# Patient Record
Sex: Male | Born: 1940 | Race: White | Hispanic: No | Marital: Married | Smoking: Never smoker
Health system: Southern US, Community
[De-identification: ages and names within clinical notes are randomized; demographics above are authoritative.]

## PROBLEM LIST (undated history)

## (undated) DIAGNOSIS — I2510 Atherosclerotic heart disease of native coronary artery without angina pectoris: Secondary | ICD-10-CM

## (undated) DIAGNOSIS — G20A1 Parkinson's disease without dyskinesia, without mention of fluctuations: Secondary | ICD-10-CM

## (undated) DIAGNOSIS — I219 Acute myocardial infarction, unspecified: Secondary | ICD-10-CM

## (undated) DIAGNOSIS — G20 Parkinson's disease: Secondary | ICD-10-CM

## (undated) DIAGNOSIS — I509 Heart failure, unspecified: Secondary | ICD-10-CM

## (undated) DIAGNOSIS — I10 Essential (primary) hypertension: Secondary | ICD-10-CM

## (undated) HISTORY — PX: PACEMAKER INSERTION: SHX728

---

## 2018-10-07 ENCOUNTER — Other Ambulatory Visit: Payer: Self-pay

## 2018-10-07 ENCOUNTER — Emergency Department (HOSPITAL_BASED_OUTPATIENT_CLINIC_OR_DEPARTMENT_OTHER)
Admission: EM | Admit: 2018-10-07 | Discharge: 2018-10-07 | Disposition: A | Discharge status: Other Acute Inpatient Hospital | Payer: Medicare HMO | Attending: Emergency Medicine | Admitting: Emergency Medicine

## 2018-10-07 ENCOUNTER — Encounter (HOSPITAL_BASED_OUTPATIENT_CLINIC_OR_DEPARTMENT_OTHER): Payer: Self-pay | Admitting: Emergency Medicine

## 2018-10-07 ENCOUNTER — Emergency Department (HOSPITAL_BASED_OUTPATIENT_CLINIC_OR_DEPARTMENT_OTHER): Payer: Medicare HMO

## 2018-10-07 DIAGNOSIS — I110 Hypertensive heart disease with heart failure: Secondary | ICD-10-CM | POA: Insufficient documentation

## 2018-10-07 DIAGNOSIS — I252 Old myocardial infarction: Secondary | ICD-10-CM | POA: Diagnosis not present

## 2018-10-07 DIAGNOSIS — G20 Parkinson's disease: Secondary | ICD-10-CM | POA: Diagnosis not present

## 2018-10-07 DIAGNOSIS — I509 Heart failure, unspecified: Secondary | ICD-10-CM | POA: Diagnosis not present

## 2018-10-07 DIAGNOSIS — Z79899 Other long term (current) drug therapy: Secondary | ICD-10-CM | POA: Insufficient documentation

## 2018-10-07 DIAGNOSIS — R079 Chest pain, unspecified: Secondary | ICD-10-CM | POA: Diagnosis present

## 2018-10-07 HISTORY — DX: Acute myocardial infarction, unspecified: I21.9

## 2018-10-07 HISTORY — DX: Atherosclerotic heart disease of native coronary artery without angina pectoris: I25.10

## 2018-10-07 HISTORY — DX: Essential (primary) hypertension: I10

## 2018-10-07 HISTORY — DX: Parkinson's disease: G20

## 2018-10-07 HISTORY — DX: Parkinson's disease without dyskinesia, without mention of fluctuations: G20.A1

## 2018-10-07 HISTORY — DX: Heart failure, unspecified: I50.9

## 2018-10-07 LAB — BRAIN NATRIURETIC PEPTIDE: B Natriuretic Peptide: 391.7 pg/mL — ABNORMAL HIGH (ref 0.0–100.0)

## 2018-10-07 LAB — BASIC METABOLIC PANEL
Anion gap: 7 (ref 5–15)
BUN: 15 mg/dL (ref 8–23)
CO2: 25 mmol/L (ref 22–32)
CREATININE: 1.27 mg/dL — AB (ref 0.61–1.24)
Calcium: 8.3 mg/dL — ABNORMAL LOW (ref 8.9–10.3)
Chloride: 104 mmol/L (ref 98–111)
GFR calc Af Amer: >60 mL/min (ref >60)
GFR calc non Af Amer: 53 mL/min — ABNORMAL LOW (ref >60)
GLUCOSE: 122 mg/dL — AB (ref 70–99)
Potassium: 3.3 mmol/L — ABNORMAL LOW (ref 3.5–5.1)
Sodium: 136 mmol/L (ref 135–145)

## 2018-10-07 LAB — CBC
HCT: 47 % (ref 39.0–52.0)
Hemoglobin: 15 g/dL (ref 13.0–17.0)
MCH: 32.8 pg (ref 26.0–34.0)
MCHC: 31.9 g/dL (ref 30.0–36.0)
MCV: 102.6 fL — ABNORMAL HIGH (ref 80.0–100.0)
Platelets: 225 10*3/uL (ref 150–400)
RBC: 4.58 MIL/uL (ref 4.22–5.81)
RDW: 11.9 % (ref 11.5–15.5)
WBC: 7.1 10*3/uL (ref 4.0–10.5)
nRBC: 0 % (ref 0.0–0.2)

## 2018-10-07 LAB — TROPONIN I: Troponin I: 0.07 ng/mL (ref <0.03)

## 2018-10-07 MED ORDER — NITROGLYCERIN 0.4 MG SL SUBL
0.4000 mg | SUBLINGUAL_TABLET | Freq: Once | SUBLINGUAL | Status: AC
  Administered 2018-10-07: 0.4 mg via SUBLINGUAL
  Filled 2018-10-07: qty 1

## 2018-10-07 MED ORDER — SODIUM CHLORIDE 0.9% FLUSH
3.0000 mL | Freq: Once | INTRAVENOUS | Status: DC
  Filled 2018-10-07: qty 3

## 2018-10-07 NOTE — ED Notes (Signed)
Patient transported to X-ray 

## 2018-10-07 NOTE — ED Notes (Addendum)
MD Pickering informed of Troponin 0.07  results

## 2018-10-07 NOTE — ED Provider Notes (Addendum)
[facility] EMERGENCY DEPARTMENT Provider Note   CSN: [id number] Arrival date & time: 10/07/18  1228    History   Chief Complaint Chief Complaint  Patient presents with  . Chest Pain    HPI Victor Miller is a 78 y.o. male.     HPI Patient presents with chest pain.  Described as more of a tightness.  Has been going since yesterday.  History of coronary artery disease shortness of breath CHF.  Recent admission to the hospital for pneumonia.  Was admitted to [facility].  Has a history of asbestosis also.  Also COPD.  On chronic oxygen.  States he feels as if he is carrying extra fluid.  Pain is dull.  Has had an occasional cough.  No sputum production.  Has a pacemaker. Past Medical History:  Diagnosis Date  . CHF (congestive heart failure) (HCC)   . Coronary artery disease   . Hypertension   . MI (myocardial infarction) (HCC)   . Parkinson's disease (HCC)     There are no active problems to display for this patient.   Past Surgical History:  Procedure Laterality Date  . PACEMAKER INSERTION          Home Medications    Prior to Admission medications   Medication Sig Start Date End Date Taking? Authorizing Provider  amantadine (SYMMETREL) 100 MG capsule Take by mouth. 04/23/18  Yes [provider]  carbidopa-levodopa (SINEMET IR) 25-100 MG tablet Take 2 tablets at 8am, 11am, 2pm, 5pm and 8pm 07/17/18  Yes [provider]  carvedilol (COREG) 6.25 MG tablet Take by mouth. 10/02/18  Yes [provider]  escitalopram (LEXAPRO) 20 MG tablet Take by mouth. 07/19/18  Yes [provider]  furosemide (LASIX) 40 MG tablet Take by mouth. 10/02/18  Yes [provider]  ipratropium-albuterol (DUONEB) 0.5-2.5 (3) MG/3ML SOLN Use one vial via nebulizer and inhale four times a day. 10/01/18  Yes [provider]  isosorbide mononitrate (IMDUR) 30 MG 24 hr tablet Take by mouth. 10/02/18  Yes [provider]  lisinopril  (PRINIVIL,ZESTRIL) 20 MG tablet Take by mouth. 09/17/18  Yes [provider]  omeprazole (PRILOSEC) 20 MG capsule Take by mouth. 09/10/18  Yes [provider]  QUEtiapine (SEROQUEL) 25 MG tablet Take by mouth. 04/23/18  Yes [provider]  traMADol Janean Sark) 50 MG tablet Take by mouth. 09/10/18 12/09/18 Yes [provider]  traZODone (DESYREL) 50 MG tablet Take by mouth. 04/23/18  Yes [provider]  aspirin 81 MG tablet Take by mouth.    [provider]  atorvastatin (LIPITOR) 20 MG tablet Take by mouth.    [provider]  nitroGLYCERIN (NITROSTAT) 0.4 MG SL tablet Place under the tongue.    [provider]  pravastatin (PRAVACHOL) 20 MG tablet Take by mouth.    [provider]    Family History No family history on file.  Social History Social History   Tobacco Use  . Smoking status: Never Smoker  . Smokeless tobacco: Never Used  Substance Use Topics  . Alcohol use: Not on file  . Drug use: Not on file     Allergies   Patient has no known allergies.   Review of Systems Review of Systems  Constitutional: Negative for appetite change.  HENT: Positive for congestion.   Respiratory: Positive for cough and shortness of breath.   Cardiovascular: Positive for chest pain and leg swelling.  Gastrointestinal: Negative for abdominal pain.  Genitourinary: Negative  for flank pain.  Musculoskeletal: Negative for back pain.  Skin: Negative for rash.  Neurological: Negative for weakness.     Physical Exam Updated Vital Signs BP 129/85 (BP Location: Right Arm)   Pulse 88   Temp 98 F (36.7 C) (Oral)   Resp (!) 22   Ht 6' (1.829 m)   Wt 82.6 kg   SpO2 98%   BMI 24.68 kg/m   Physical Exam Vitals signs and nursing note reviewed.  HENT:     Head: Normocephalic.  Neck:     Musculoskeletal: Neck supple.  Cardiovascular:     Rate and Rhythm: Normal rate and regular rhythm.  Pulmonary:     Effort:  Pulmonary effort is normal.     Breath sounds: Rales present.  Chest:     Chest wall: No tenderness.  Abdominal:     Tenderness: There is no abdominal tenderness.  Musculoskeletal:     Comments: Minimal edema bilateral lower extremities  Skin:    General: Skin is warm.     Capillary Refill: Capillary refill takes less than 2 seconds.  Neurological:     Mental Status: He is alert.      ED Treatments / Results  Labs (all labs ordered are listed, but only abnormal results are displayed) Labs Reviewed  BASIC METABOLIC PANEL - Abnormal; Notable for the following components:      Result Value   Potassium 3.3 (*)    Glucose, Bld 122 (*)    Creatinine, Ser 1.27 (*)    Calcium 8.3 (*)    GFR calc non Af Amer 53 (*)    All other components within normal limits  CBC - Abnormal; Notable for the following components:   MCV 102.6 (*)    All other components within normal limits  TROPONIN I - Abnormal; Notable for the following components:   Troponin I 0.07 (*)    All other components within normal limits  BRAIN NATRIURETIC PEPTIDE - Abnormal; Notable for the following components:   B Natriuretic Peptide 391.7 (*)    All other components within normal limits    EKG EKG Interpretation  Date/Time:  Sunday October 07 2018 12:34:11 EST Ventricular Rate:  88 PR Interval:    QRS Duration: 117 QT Interval:  389 QTC Calculation: 471 R Axis:   -34 Text Interpretation:  Sinus rhythm Incomplete left bundle branch block Confirmed by Benjiman Core [phone]) on 10/07/2018 1:29:35 PM   Radiology Dg Chest 2 View  Result Date: 10/07/2018 CLINICAL DATA:  Chest pain and shortness of breath. Cough. EXAM: CHEST - 2 VIEW COMPARISON:  None. FINDINGS: Dual lead cardiac pacemaker/defibrillator in appropriate position. Enlarged cardiac silhouette. Calcific atherosclerotic disease and tortuosity of the aorta. Bilateral pleural effusions, right greater than left. Bilateral lower lobe atelectasis  versus airspace consolidation. Masslike opacity overlies the lower thoracic spine, seen on the lateral view. Osseous structures are without acute abnormality. Soft tissues are grossly normal. IMPRESSION: 1. Enlarged cardiac silhouette. 2. Bilateral pleural effusions, right greater than left. 3. Bilateral lower lobe atelectasis versus airspace consolidation. 4. Masslike opacity overlies the lower thoracic spine, seen on the lateral view. This may represent loculated pleural fluid, however underlying pulmonary or chest wall mass cannot be excluded. Electronically Signed   By: Ted Mcalpine M.D.   On: 10/07/2018 13:46    Procedures Procedures (including critical care time)  Medications Ordered in ED Medications  sodium chloride flush (NS) 0.9 % injection 3 mL (3 mLs Intravenous Not Given 10/07/18 1259)  nitroGLYCERIN (NITROSTAT) SL tablet 0.4 mg (has no administration in time range)     Initial Impression / Assessment and Plan / ED Course  I have reviewed the triage vital signs and the nursing notes.  Pertinent labs & imaging results that were available during my care of the patient were reviewed by me and considered in my medical decision making (see chart for details).        Patient with chest pain shortness of breath.  Has had for last couple days.  Recent reported pneumonia.  History of coronary artery disease.  Recent admission at [facility].  Lab works appears somewhat stable to recent admission x-ray shows nonspecific findings that appear to match up with CT scan.  Not clearly infectious.  With worsening chest pain feels patient benefit for admission to the hospital.  Will attempt transferred back [facility].  Patient accepted at [facility] by Dr. Fenton Foy to a cardiac telemetry bed. After 1 sublingual nitroglycerin patient now has no chest pain.  Final Clinical Impressions(s) / ED Diagnoses   Final diagnoses:  Chest pain, unspecified type  Acute on chronic congestive heart  failure, unspecified heart failure type [facility])    ED Discharge Orders    None       Benjiman Core, MD 10/07/18 1413    Benjiman Core, MD 10/07/18 [phone]

## 2018-10-07 NOTE — ED Notes (Signed)
Pt b/p 94/68 reported to Dr Rubin Payor. No additional nitro administered

## 2018-10-07 NOTE — ED Triage Notes (Signed)
Chest pain since yesterday. Pain is non radiating. Reports increased SOB. He is on 2 liter O2 all the time.

## 2018-10-07 NOTE — ED Notes (Signed)
Troponin 0.07, results given to ED MD and Augusto Gamble RN

## 2019-07-16 DEATH — deceased

## 2020-10-31 IMAGING — DX DG CHEST 2V
2 series · 2 of 2 positions shown · non-contrast
Comparison: None.

CLINICAL DATA: Chest pain and shortness of breath. Cough.

EXAM:
CHEST - 2 VIEW

[chest pa]
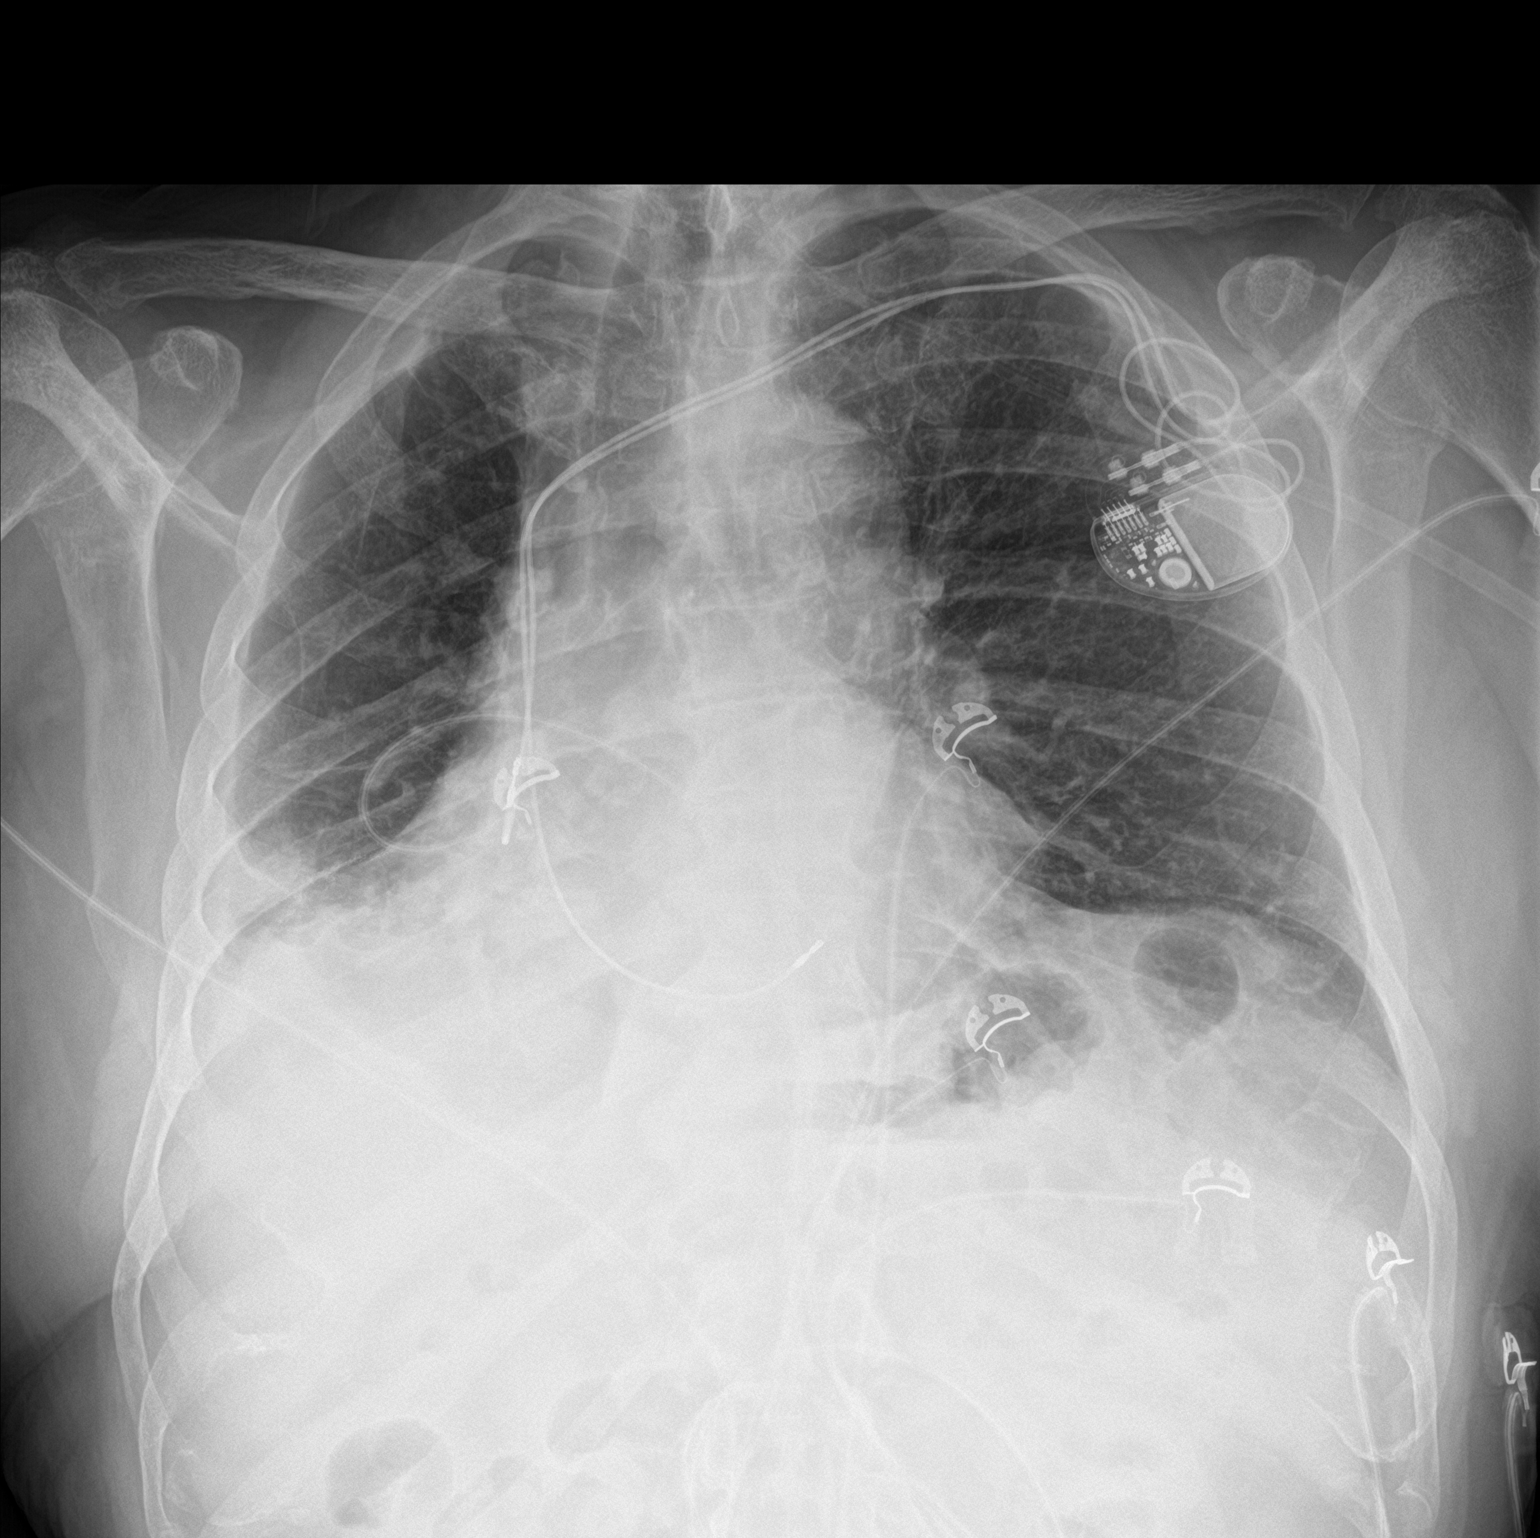

[chest lat]
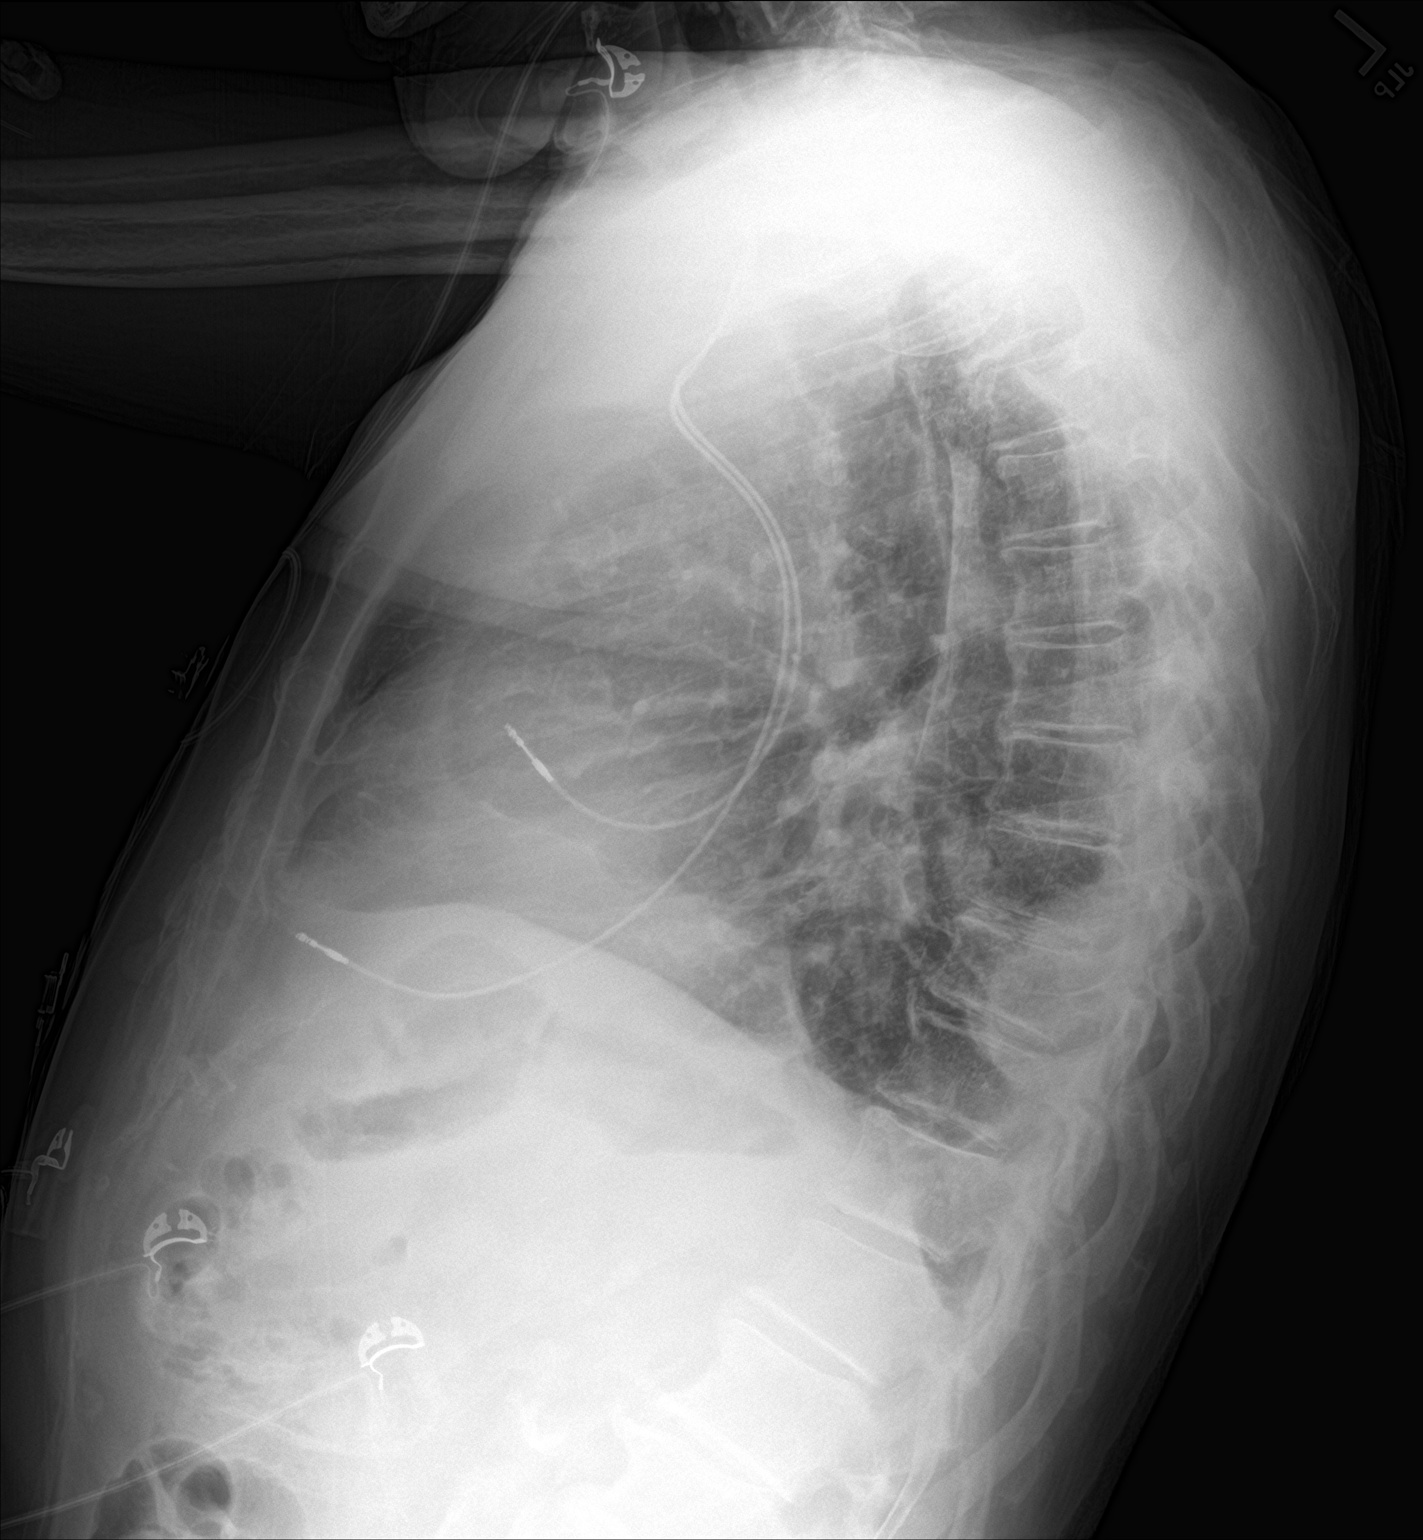

[2 of 2 positions shown; findings below may reference images not displayed]

FINDINGS: Dual lead cardiac pacemaker/defibrillator in appropriate position.

Enlarged cardiac silhouette. Calcific atherosclerotic disease and
tortuosity of the aorta.

Bilateral pleural effusions, right greater than left. Bilateral
lower lobe atelectasis versus airspace consolidation. Masslike
opacity overlies the lower thoracic spine, seen on the lateral view.

Osseous structures are without acute abnormality. Soft tissues are
grossly normal.
IMPRESSION: 1. Enlarged cardiac silhouette.
2. Bilateral pleural effusions, right greater than left.
3. Bilateral lower lobe atelectasis versus airspace consolidation.
4. Masslike opacity overlies the lower thoracic spine, seen on the
lateral view. This may represent loculated pleural fluid, however
underlying pulmonary or chest wall mass cannot be excluded.
